# Patient Record
Sex: Female | Born: 1940 | Race: White | Hispanic: No | Marital: Married | Smoking: Never smoker
Health system: Southern US, Community
[De-identification: ages and names within clinical notes are randomized; demographics above are authoritative.]

## PROBLEM LIST (undated history)

## (undated) DIAGNOSIS — I639 Cerebral infarction, unspecified: Secondary | ICD-10-CM

## (undated) DIAGNOSIS — F329 Major depressive disorder, single episode, unspecified: Secondary | ICD-10-CM

## (undated) DIAGNOSIS — F32A Depression, unspecified: Secondary | ICD-10-CM

## (undated) DIAGNOSIS — I509 Heart failure, unspecified: Secondary | ICD-10-CM

## (undated) DIAGNOSIS — I2510 Atherosclerotic heart disease of native coronary artery without angina pectoris: Secondary | ICD-10-CM

## (undated) DIAGNOSIS — F419 Anxiety disorder, unspecified: Secondary | ICD-10-CM

## (undated) DIAGNOSIS — G4700 Insomnia, unspecified: Secondary | ICD-10-CM

## (undated) DIAGNOSIS — E119 Type 2 diabetes mellitus without complications: Secondary | ICD-10-CM

## (undated) DIAGNOSIS — G8929 Other chronic pain: Secondary | ICD-10-CM

---
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
## 2019-01-31 ENCOUNTER — Emergency Department (HOSPITAL_COMMUNITY)
Admission: EM | Admit: 2019-01-31 | Discharge: 2019-01-31 | Disposition: A | Discharge status: Home / Self Care | Payer: 59 | Attending: Emergency Medicine | Admitting: Emergency Medicine

## 2019-01-31 ENCOUNTER — Emergency Department (HOSPITAL_COMMUNITY): Payer: 59

## 2019-01-31 ENCOUNTER — Other Ambulatory Visit: Payer: Self-pay

## 2019-01-31 ENCOUNTER — Encounter (HOSPITAL_COMMUNITY): Payer: Self-pay | Admitting: *Deleted

## 2019-01-31 DIAGNOSIS — F0390 Unspecified dementia without behavioral disturbance: Secondary | ICD-10-CM | POA: Diagnosis not present

## 2019-01-31 DIAGNOSIS — I2510 Atherosclerotic heart disease of native coronary artery without angina pectoris: Secondary | ICD-10-CM | POA: Insufficient documentation

## 2019-01-31 DIAGNOSIS — E119 Type 2 diabetes mellitus without complications: Secondary | ICD-10-CM | POA: Insufficient documentation

## 2019-01-31 DIAGNOSIS — R0789 Other chest pain: Secondary | ICD-10-CM | POA: Insufficient documentation

## 2019-01-31 DIAGNOSIS — I509 Heart failure, unspecified: Secondary | ICD-10-CM | POA: Insufficient documentation

## 2019-01-31 HISTORY — DX: Heart failure, unspecified: I50.9

## 2019-01-31 HISTORY — DX: Depression, unspecified: F32.A

## 2019-01-31 HISTORY — DX: Anxiety disorder, unspecified: F41.9

## 2019-01-31 HISTORY — DX: Insomnia, unspecified: G47.00

## 2019-01-31 HISTORY — DX: Atherosclerotic heart disease of native coronary artery without angina pectoris: I25.10

## 2019-01-31 HISTORY — DX: Cerebral infarction, unspecified: I63.9

## 2019-01-31 HISTORY — DX: Other chronic pain: G89.29

## 2019-01-31 HISTORY — DX: Type 2 diabetes mellitus without complications: E11.9

## 2019-01-31 LAB — CBC WITH DIFFERENTIAL/PLATELET
Abs Immature Granulocytes: 0.02 10*3/uL (ref 0.00–0.07)
Basophils Absolute: 0 10*3/uL (ref 0.0–0.1)
Basophils Relative: 0 %
Eosinophils Absolute: 0.1 10*3/uL (ref 0.0–0.5)
Eosinophils Relative: 2 %
HCT: 35.9 % — ABNORMAL LOW (ref 36.0–46.0)
Hemoglobin: 10.9 g/dL — ABNORMAL LOW (ref 12.0–15.0)
Immature Granulocytes: 0 %
Lymphocytes Relative: 26 %
Lymphs Abs: 1.9 10*3/uL (ref 0.7–4.0)
MCH: 28.1 pg (ref 26.0–34.0)
MCHC: 30.4 g/dL (ref 30.0–36.0)
MCV: 92.5 fL (ref 80.0–100.0)
Monocytes Absolute: 0.8 10*3/uL (ref 0.1–1.0)
Monocytes Relative: 11 %
Neutro Abs: 4.5 10*3/uL (ref 1.7–7.7)
Neutrophils Relative %: 61 %
Platelets: ADEQUATE 10*3/uL (ref 150–400)
RBC: 3.88 MIL/uL (ref 3.87–5.11)
RDW: 13.1 % (ref 11.5–15.5)
WBC: 7.5 10*3/uL (ref 4.0–10.5)
nRBC: 0 % (ref 0.0–0.2)

## 2019-01-31 LAB — TROPONIN I (HIGH SENSITIVITY)
Troponin I (High Sensitivity): 22 ng/L — ABNORMAL HIGH (ref <18)
Troponin I (High Sensitivity): 24 ng/L — ABNORMAL HIGH (ref <18)

## 2019-01-31 LAB — BASIC METABOLIC PANEL
Anion gap: 9 (ref 5–15)
BUN: 24 mg/dL — ABNORMAL HIGH (ref 8–23)
CO2: 24 mmol/L (ref 22–32)
Calcium: 8.7 mg/dL — ABNORMAL LOW (ref 8.9–10.3)
Chloride: 106 mmol/L (ref 98–111)
Creatinine, Ser: 0.9 mg/dL (ref 0.44–1.00)
GFR calc Af Amer: >60 mL/min (ref >60)
GFR calc non Af Amer: >60 mL/min (ref >60)
Glucose, Bld: 267 mg/dL — ABNORMAL HIGH (ref 70–99)
Potassium: 4.3 mmol/L (ref 3.5–5.1)
Sodium: 139 mmol/L (ref 135–145)

## 2019-01-31 MED ORDER — MORPHINE SULFATE 10 MG/5ML PO SOLN
15.0000 mg | Freq: Once | ORAL | Status: DC
  Filled 2019-01-31: qty 8

## 2019-01-31 MED ORDER — NITROGLYCERIN 0.4 MG SL SUBL
0.4000 mg | SUBLINGUAL_TABLET | SUBLINGUAL | Status: DC | PRN
  Administered 2019-01-31: 0.4 mg via SUBLINGUAL
  Filled 2019-01-31: qty 1

## 2019-01-31 MED ORDER — MORPHINE SULFATE 10 MG/5ML PO SOLN
8.0000 mg | Freq: Once | ORAL | Status: AC
  Administered 2019-01-31: 8 mg via ORAL

## 2019-01-31 NOTE — ED Notes (Signed)
Pt called out asking for some pain meds. 

## 2019-01-31 NOTE — ED Triage Notes (Signed)
EMS report Pt reported CP to day at SNF. Pt reports she has Lt sided CP on arrival to ED. PT points to LT side of chest. Pt is reported to have tested postive at [facility]. PT AOL x 2

## 2019-01-31 NOTE — ED Provider Notes (Signed)
[facility] EMERGENCY DEPARTMENT Provider Note   CSN: [id number] Arrival date & time: 01/31/19  1446   LEVEL 5 CAVEAT - DEMENTIA   History   Chief Complaint Chief Complaint  Patient presents with  . Chest Pain    HPI Kimberly Martin is a 78 y.o. female.     HPI  78 year old female brought in by EMS from nursing facility with chest pain.  Started sometime earlier today.  History is somewhat limited based on the patient having dementia.  However she reports this chest pain is sharp in her left chest and feels similar to when she has had anginal chest pain in the past.  Pain is about a 7 out of 10.  EMS gave aspirin.  Patient is COVID positive from the nursing facility.  Past Medical History:  Diagnosis Date  . Anxiety   . CHF (congestive heart failure) ([place])   . Chronic pain   . Coronary artery disease   . Depression   . Diabetes mellitus without complication ([place])   . Insomnia   . Stroke [facility])     There are no active problems to display for this patient.   History reviewed. No pertinent surgical history.   OB History   No obstetric history on file.      Home Medications    Prior to Admission medications   Not on File    Family History History reviewed. No pertinent family history.  Social History Social History   Tobacco Use  . Smoking status: Never Smoker  . Smokeless tobacco: Never Used  Substance Use Topics  . Alcohol use: Not Currently  . Drug use: Never     Allergies   Iodine and Other   Review of Systems Review of Systems  Unable to perform ROS: Dementia     Physical Exam Updated Vital Signs BP (!) 172/77 (BP Location: Right Arm)   Pulse 85   Temp 98.8 F (37.1 C) (Oral)   Resp 20   Ht 5\' 2"  (1.575 m)   SpO2 98%   Physical Exam Vitals signs and nursing note reviewed.  Constitutional:      Appearance: She is well-developed.  HENT:     Head: Normocephalic and atraumatic.     Right Ear: External ear  normal.     Left Ear: External ear normal.     Nose: Nose normal.  Eyes:     General:        Right eye: No discharge.        Left eye: No discharge.  Cardiovascular:     Rate and Rhythm: Normal rate and regular rhythm.     Heart sounds: Normal heart sounds.  Pulmonary:     Effort: Pulmonary effort is normal.     Breath sounds: Normal breath sounds.  Chest:     Chest wall: Tenderness (left anterior chest) present.  Abdominal:     Palpations: Abdomen is soft.     Tenderness: There is no abdominal tenderness.  Musculoskeletal:     Right lower leg: No edema.     Left lower leg: No edema.  Skin:    General: Skin is warm and dry.  Neurological:     Mental Status: She is alert.  Psychiatric:        Mood and Affect: Mood is not anxious.      ED Treatments / Results  Labs (all labs ordered are listed, but only abnormal results are displayed) Labs Reviewed  BASIC  METABOLIC PANEL - Abnormal; Notable for the following components:      Result Value   Glucose, Bld 267 (*)    BUN 24 (*)    Calcium 8.7 (*)    All other components within normal limits  CBC WITH DIFFERENTIAL/PLATELET - Abnormal; Notable for the following components:   Hemoglobin 10.9 (*)    HCT 35.9 (*)    All other components within normal limits  TROPONIN I (HIGH SENSITIVITY) - Abnormal; Notable for the following components:   Troponin I (High Sensitivity) 22 (*)    All other components within normal limits  TROPONIN I (HIGH SENSITIVITY) - Abnormal; Notable for the following components:   Troponin I (High Sensitivity) 24 (*)    All other components within normal limits    EKG EKG Interpretation  Date/Time:  Sunday January 31 2019 14:59:57 EDT Ventricular Rate:  81 PR Interval:    QRS Duration: 124 QT Interval:  393 QTC Calculation: 457 R Axis:   43 Text Interpretation:  Sinus rhythm Left bundle branch block No old tracing to compare Confirmed by Pricilla LovelessGoldston, Gene Glazebrook [phone]([id number]) on 01/31/2019 3:25:12 PM    Radiology Dg Chest Portable 1 View  Result Date: 01/31/2019 CLINICAL DATA:  Chest pain EXAM: PORTABLE CHEST 1 VIEW COMPARISON:  None. FINDINGS: There is mild atelectatic change in the lateral right base. The lungs elsewhere are clear. Heart size and pulmonary vascularity are normal. No adenopathy. No bone lesions. IMPRESSION: Mild atelectatic change in the lateral right base. Lungs elsewhere clear. No adenopathy. Electronically Signed   By: Bretta BangWilliam  Woodruff III M.D.   On: 01/31/2019 15:35    Procedures Procedures (including critical care time)  Medications Ordered in ED Medications  nitroGLYCERIN (NITROSTAT) SL tablet 0.4 mg (0.4 mg Sublingual Given 01/31/19 1746)  morphine 10 MG/5ML solution 8 mg (8 mg Oral Given 01/31/19 1837)     Initial Impression / Assessment and Plan / ED Course  I have reviewed the triage vital signs and the nursing notes.  Pertinent labs & imaging results that were available during my care of the patient were reviewed by me and considered in my medical decision making (see chart for details).        Patient's chest pain is atypical and reproducible.  She does have known coronary disease though this is pretty atypical currently.  I discussed with the patient's daughter, Dolan Amenammy Clark, who states this is a recurrent problem. Given 2 troponins that are in the 20s but flat and not rising like MI, she prefers to discharge patient back to facility. My suspicion for PE is lower given no hypoxia, increased WOB or tachycardia. Appears stable to go back to facility.  Kimberly Martin was evaluated in Emergency Department on 01/31/2019 for the symptoms described in the history of present illness. She was evaluated in the context of the global COVID-19 pandemic, which necessitated consideration that the patient might be at risk for infection with the SARS-CoV-2 virus that causes COVID-19. Institutional protocols and algorithms that pertain to the evaluation of patients at risk for  COVID-19 are in a state of rapid change based on information released by regulatory bodies including the CDC and federal and state organizations. These policies and algorithms were followed during the patient's care in the ED.   Final Clinical Impressions(s) / ED Diagnoses   Final diagnoses:  Atypical chest pain    ED Discharge Orders    None       Pricilla LovelessGoldston, Amora Sheehy, MD 01/31/19 [id number]

## 2019-01-31 NOTE — ED Notes (Signed)
Pt repeatedly called out for help. Nt answered every calls and came into the room many times to check on pt.

## 2019-01-31 NOTE — Discharge Instructions (Signed)
If you develop recurrent, continued, or worsening chest pain, shortness of breath, fever, vomiting, abdominal or back pain, or any other new/concerning symptoms then return to the ER for evaluation.  

## 2020-03-01 IMAGING — DX PORTABLE CHEST - 1 VIEW
1 series · 1 of 1 positions shown · non-contrast
Comparison: None.

CLINICAL DATA: Chest pain

EXAM:
PORTABLE CHEST 1 VIEW

[chest ap]
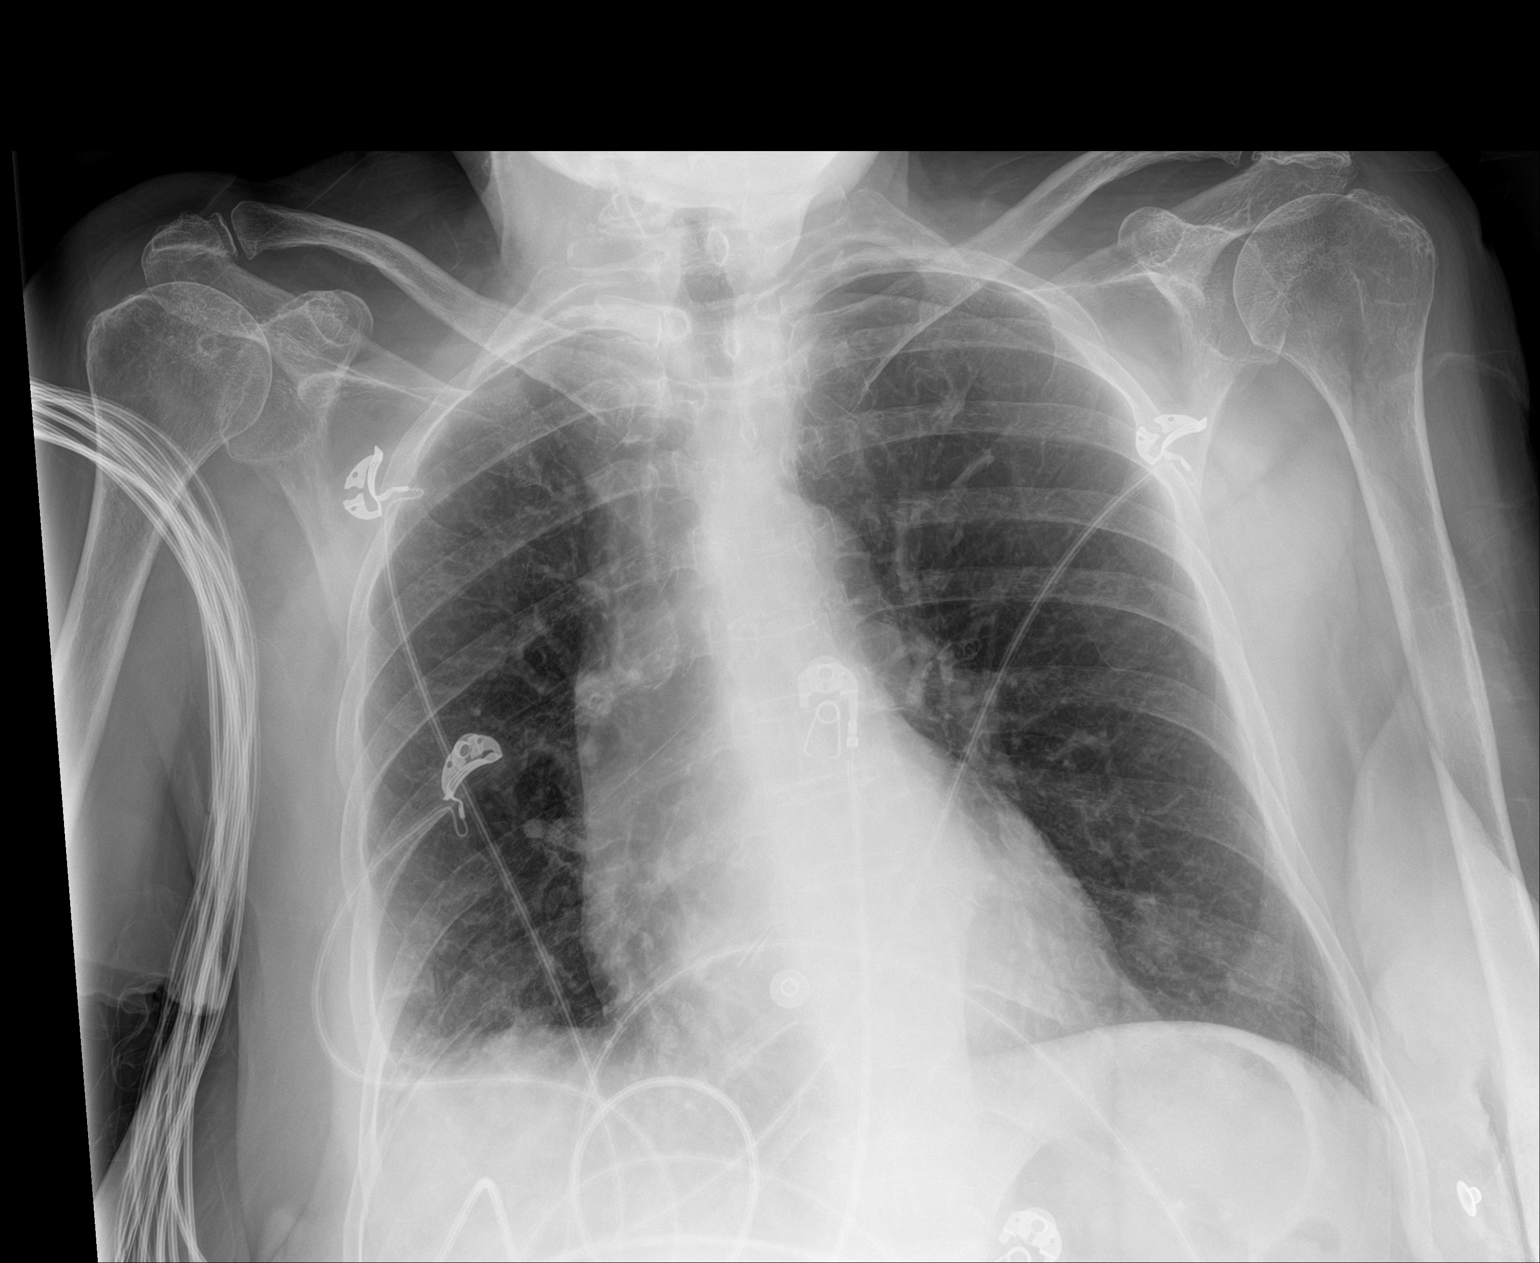

[1 of 1 positions shown; findings below may reference images not displayed]

FINDINGS: There is mild atelectatic change in the lateral right base. The
lungs elsewhere are clear. Heart size and pulmonary vascularity are
normal. No adenopathy. No bone lesions.
IMPRESSION: Mild atelectatic change in the lateral right base. Lungs elsewhere
clear. No adenopathy.

## 2023-05-02 DEATH — deceased
